# Patient Record
Sex: Female | Born: 2000 | Race: Black or African American | Hispanic: No | Marital: Single | Smoking: Never smoker
Health system: Southern US, Community
[De-identification: ages and names within clinical notes are randomized; demographics above are authoritative.]

## PROBLEM LIST (undated history)

## (undated) ENCOUNTER — Ambulatory Visit (HOSPITAL_COMMUNITY): Admission: EM | Payer: No Typology Code available for payment source

## (undated) DIAGNOSIS — J45909 Unspecified asthma, uncomplicated: Secondary | ICD-10-CM

## (undated) HISTORY — PX: NO PAST SURGERIES: SHX2092

---
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
## 2019-09-22 ENCOUNTER — Encounter (HOSPITAL_COMMUNITY): Payer: Self-pay

## 2019-09-22 ENCOUNTER — Ambulatory Visit (HOSPITAL_COMMUNITY)
Admission: EM | Admit: 2019-09-22 | Discharge: 2019-09-22 | Disposition: A | Discharge status: Home / Self Care | Payer: BLUE CROSS/BLUE SHIELD | Attending: Family Medicine | Admitting: Family Medicine

## 2019-09-22 ENCOUNTER — Other Ambulatory Visit: Payer: Self-pay

## 2019-09-22 DIAGNOSIS — Z20828 Contact with and (suspected) exposure to other viral communicable diseases: Secondary | ICD-10-CM | POA: Insufficient documentation

## 2019-09-22 DIAGNOSIS — R112 Nausea with vomiting, unspecified: Secondary | ICD-10-CM | POA: Insufficient documentation

## 2019-09-22 MED ORDER — ONDANSETRON 4 MG PO TBDP: 4.0000 mg | ORAL_TABLET | Freq: Three times a day (TID) | ORAL | 0 refills | Status: AC | PRN

## 2019-09-22 NOTE — ED Provider Notes (Signed)
[facility]    CSN: [id number] Arrival date & time: 09/22/19  1338      History   Chief Complaint Chief Complaint  Patient presents with  . food poison    HPI Cathy Nguyen is a 18 y.o. female history of asthma presenting today for Covid testing and evaluation of vomiting.  Patient states that she was at work earlier today and had an episode of vomiting.  She relates this to eating bad sushi.  Since the episode of vomiting she has not continued to feel nauseous and denies any associate abdominal pain.  Denies fevers chills or body aches.  Denies any URI symptoms of cough congestion or sore throat.  Denies any known Covid exposure.  Patient would like to be Covid tested in order to return back to work.  Denies diarrhea or change in bowel movements.  Has tolerated fluids since initial vomiting episode.  HPI  Past Medical History:  Diagnosis Date  . Asthma     There are no active problems to display for this patient.   History reviewed. No pertinent surgical history.  OB History   No obstetric history on file.      Home Medications    Prior to Admission medications   Medication Sig Start Date End Date Taking? Authorizing Provider  HYDROcodone-acetaminophen (NORCO/VICODIN) 5-325 MG per tablet Take 1-2 tablets by mouth every 6 (six) hours as needed for severe pain. 12/28/14   Piepenbrink, Victorino Dike, PA-C  ondansetron (ZOFRAN ODT) 4 MG disintegrating tablet Take 1 tablet (4 mg total) by mouth every 8 (eight) hours as needed for nausea or vomiting. 09/22/19   Jenni Thew, Junius Creamer, PA-C    Family History History reviewed. No pertinent family history.  Social History Social History   Tobacco Use  . Smoking status: Never Smoker  . Smokeless tobacco: Never Used  Substance Use Topics  . Alcohol use: No  . Drug use: Not on file     Allergies   Patient has no known allergies.   Review of Systems Review of Systems  Constitutional: Negative for activity  change, appetite change, chills, fatigue and fever.  HENT: Negative for congestion, ear pain, rhinorrhea, sinus pressure, sore throat and trouble swallowing.   Eyes: Negative for discharge and redness.  Respiratory: Negative for cough, chest tightness and shortness of breath.   Cardiovascular: Negative for chest pain.  Gastrointestinal: Positive for vomiting. Negative for abdominal pain, diarrhea and nausea.  Musculoskeletal: Negative for myalgias.  Skin: Negative for rash.  Neurological: Negative for dizziness, light-headedness and headaches.     Physical Exam Triage Vital Signs ED Triage Vitals  Enc Vitals Group     BP 09/22/19 1411 126/73     Pulse Rate 09/22/19 1411 82     Resp 09/22/19 1411 17     Temp 09/22/19 1411 98 F (36.7 C)     Temp Source 09/22/19 1411 Tympanic     SpO2 09/22/19 1411 99 %     Weight 09/22/19 1440 210 lb (95.3 kg)     Height --      Head Circumference --      Peak Flow --      Pain Score 09/22/19 1440 3     Pain Loc --      Pain Edu? --      Excl. in GC? --    No data found.  Updated Vital Signs BP 126/73 (BP Location: Left Arm)   Pulse 82   Temp 98 F (  36.7 C) (Tympanic)   Resp 17   Wt 210 lb (95.3 kg)   LMP 09/08/2019 (LMP Unknown)   SpO2 99%   Visual Acuity Right Eye Distance:   Left Eye Distance:   Bilateral Distance:    Right Eye Near:   Left Eye Near:    Bilateral Near:     Physical Exam Vitals signs and nursing note reviewed.  Constitutional:      General: She is not in acute distress.    Appearance: She is well-developed.  HENT:     Head: Normocephalic and atraumatic.     Mouth/Throat:     Comments: Oral mucosa pink and moist, no tonsillar enlargement or exudate. Posterior pharynx patent and nonerythematous, no uvula deviation or swelling. Normal phonation.  Eyes:     Conjunctiva/sclera: Conjunctivae normal.  Neck:     Musculoskeletal: Neck supple.  Cardiovascular:     Rate and Rhythm: Normal rate and regular  rhythm.     Heart sounds: No murmur.  Pulmonary:     Effort: Pulmonary effort is normal. No respiratory distress.     Breath sounds: Normal breath sounds.     Comments: Breathing comfortably at rest, CTABL, no wheezing, rales or other adventitious sounds auscultated Abdominal:     Palpations: Abdomen is soft.     Tenderness: There is no abdominal tenderness.     Comments: Soft, nondistended, nontender to light and deep palpation throughout entire abdomen, patient is ticklish with abdominal exam  Skin:    General: Skin is warm and dry.  Neurological:     Mental Status: She is alert.      UC Treatments / Results  Labs (all labs ordered are listed, but only abnormal results are displayed) Labs Reviewed  NOVEL CORONAVIRUS, NAA (HOSP ORDER, SEND-OUT TO REF LAB; TAT 18-24 HRS)    EKG   Radiology No results found.  Procedures Procedures (including critical care time)  Medications Ordered in UC Medications - No data to display  Initial Impression / Assessment and Plan / UC Course  I have reviewed the triage vital signs and the nursing notes.  Pertinent labs & imaging results that were available during my care of the patient were reviewed by me and considered in my medical decision making (see chart for details).    Covid swab pending.  Isolated episode of vomiting earlier today, no persistent symptoms.  Tolerating oral intake.  Will provide Zofran to use as needed if nausea and vomiting were to return.  No abdominal pain, do not suspect abdominal emergency.  Otherwise push fluids and remain home until Covid returns.Discussed strict return precautions. Patient verbalized understanding and is agreeable with plan.  Final Clinical Impressions(s) / UC Diagnoses   Final diagnoses:  Non-intractable vomiting with nausea, unspecified vomiting type     Discharge Instructions     COVID pending Zofran as needed for nausea Follow up if developing worsening symptoms, abodminal pain     ED Prescriptions    Medication Sig Dispense Auth. Provider   ondansetron (ZOFRAN ODT) 4 MG disintegrating tablet Take 1 tablet (4 mg total) by mouth every 8 (eight) hours as needed for nausea or vomiting. 20 tablet Brette Cast, [place] C, PA-C     PDMP not reviewed this encounter.   Janith Lima, PA-C 09/22/19 1524

## 2019-09-22 NOTE — Discharge Instructions (Signed)
COVID pending Zofran as needed for nausea Follow up if developing worsening symptoms, abodminal pain

## 2019-09-22 NOTE — ED Triage Notes (Signed)
Pt states that she had some bad sushi and needs a work note to return back to work. Pt states she would like a covid test today.

## 2019-09-24 LAB — NOVEL CORONAVIRUS, NAA (HOSP ORDER, SEND-OUT TO REF LAB; TAT 18-24 HRS): SARS-CoV-2, NAA: NOT DETECTED

## 2019-12-02 NOTE — L&D Delivery Note (Signed)
Delivery Note [facility]    Patient Name: Cathy Nguyen DOB: 09/14/2001 MRN: [id number]  Date of admission: 07/29/2020 Delivering MD: Dale San [place]  Date of delivery: 07/29/20 Type of delivery: SVD  Newborn Data: Live born female  Birth Weight:   APGAR: 8, 9   Newborn Delivery   Birth date/time: 07/29/2020 19:18:00 Delivery type:      [name], 19 y.o., @ [redacted]w[redacted]d,  G1P0, who was admitted for spontaneous latent labor and progressed after prodromal labor with AROM and pitcoin with moderate meconium. I was called to the room when she progressed 2+ station in the second stage of labor.  She pushed for 45/min.  She delivered a viable infant, cephalic and restituted to the LOA position over an intact perineum.  A nuchal cord   was not identified. The baby was placed on maternal abdomen while initial step of NRP were perfmored (Dry, Stimulated, and warmed). Hat placed on baby for thermoregulation. Delayed cord clamping was performed for 2 minutes.  Cord double clamped and cut.  Cord cut by father. Apgar scores were 8 and 9. Prophylactic Pitocin was started in the third stage of labor for active management. The placenta delivered spontaneously, shultz, with a 3 vessel cord and was sent to LD.  Inspection revealed 2nd degree. An examination of the vaginal vault and cervix was free from lacerations. The uterus was firm, bleeding stable.  The repair was done under epidural.   Placenta and umbilical artery blood gas were not sent.  There were no complications during the procedure.  Mom and baby skin to skin following delivery. Left in stable condition.  Maternal Info: Anesthesia:Epidural Episiotomy: NO Lacerations:  2.0 degree Suture Repair: 3.0 vicryl CT and 4.0 vicryl SH Est. Blood Loss (mL):   Newborn Info:  Baby Sex: female Circumcision: Desires in pt APGAR (1 MIN):  8 APGAR (5 MINS): 9  APGAR (10 MINS):     Mom to postpartum.  Baby to Couplet care / Skin to Skin.  DR Pin  aware   [place], CNM, NP-C 07/29/20 8:34 PM

## 2019-12-29 LAB — OB RESULTS CONSOLE ABO/RH: RH Type: POSITIVE

## 2019-12-29 LAB — OB RESULTS CONSOLE HEPATITIS B SURFACE ANTIGEN: Hepatitis B Surface Ag: NEGATIVE

## 2019-12-29 LAB — OB RESULTS CONSOLE ANTIBODY SCREEN: Antibody Screen: NEGATIVE

## 2019-12-29 LAB — OB RESULTS CONSOLE HIV ANTIBODY (ROUTINE TESTING): HIV: NONREACTIVE

## 2019-12-29 LAB — OB RESULTS CONSOLE RPR: RPR: NONREACTIVE

## 2019-12-29 LAB — OB RESULTS CONSOLE RUBELLA ANTIBODY, IGM: Rubella: IMMUNE

## 2020-01-09 LAB — OB RESULTS CONSOLE GC/CHLAMYDIA
Chlamydia: NEGATIVE
Gonorrhea: NEGATIVE

## 2020-07-26 ENCOUNTER — Telehealth (HOSPITAL_COMMUNITY): Payer: Self-pay | Admitting: *Deleted

## 2020-07-26 ENCOUNTER — Encounter (HOSPITAL_COMMUNITY): Payer: Self-pay | Admitting: *Deleted

## 2020-07-26 NOTE — Telephone Encounter (Signed)
Preadmission screen  

## 2020-07-27 ENCOUNTER — Telehealth (HOSPITAL_COMMUNITY): Payer: Self-pay | Admitting: *Deleted

## 2020-07-27 ENCOUNTER — Encounter (HOSPITAL_COMMUNITY): Payer: Self-pay | Admitting: *Deleted

## 2020-07-27 NOTE — Telephone Encounter (Signed)
Preadmission screen  

## 2020-07-28 ENCOUNTER — Other Ambulatory Visit (HOSPITAL_COMMUNITY)
Admission: RE | Admit: 2020-07-28 | Discharge: 2020-07-28 | Disposition: A | Discharge status: Home / Self Care | Payer: BC Managed Care – PPO | Source: Ambulatory Visit | Attending: Obstetrics and Gynecology | Admitting: Obstetrics and Gynecology

## 2020-07-28 DIAGNOSIS — Z01812 Encounter for preprocedural laboratory examination: Secondary | ICD-10-CM | POA: Insufficient documentation

## 2020-07-28 DIAGNOSIS — O26893 Other specified pregnancy related conditions, third trimester: Secondary | ICD-10-CM | POA: Diagnosis not present

## 2020-07-28 DIAGNOSIS — Z20822 Contact with and (suspected) exposure to covid-19: Secondary | ICD-10-CM | POA: Insufficient documentation

## 2020-07-28 LAB — SARS CORONAVIRUS 2 (TAT 6-24 HRS): SARS Coronavirus 2: NEGATIVE

## 2020-07-29 ENCOUNTER — Other Ambulatory Visit: Payer: Self-pay

## 2020-07-29 ENCOUNTER — Inpatient Hospital Stay (HOSPITAL_COMMUNITY): Payer: BC Managed Care – PPO | Admitting: Anesthesiology

## 2020-07-29 ENCOUNTER — Encounter (HOSPITAL_COMMUNITY): Payer: Self-pay | Admitting: Obstetrics and Gynecology

## 2020-07-29 ENCOUNTER — Inpatient Hospital Stay (HOSPITAL_COMMUNITY)
Admission: EM | Admit: 2020-07-29 | Discharge: 2020-07-31 | DRG: 807 | Disposition: A | Discharge status: Home / Self Care | Payer: BC Managed Care – PPO | Attending: Obstetrics and Gynecology | Admitting: Obstetrics and Gynecology

## 2020-07-29 DIAGNOSIS — O99214 Obesity complicating childbirth: Secondary | ICD-10-CM | POA: Diagnosis present

## 2020-07-29 DIAGNOSIS — Z01812 Encounter for preprocedural laboratory examination: Secondary | ICD-10-CM | POA: Diagnosis not present

## 2020-07-29 DIAGNOSIS — O26893 Other specified pregnancy related conditions, third trimester: Secondary | ICD-10-CM | POA: Diagnosis present

## 2020-07-29 DIAGNOSIS — Z20822 Contact with and (suspected) exposure to covid-19: Secondary | ICD-10-CM | POA: Diagnosis present

## 2020-07-29 DIAGNOSIS — Z3A40 40 weeks gestation of pregnancy: Secondary | ICD-10-CM

## 2020-07-29 LAB — CBC
HCT: 36.6 % (ref 36.0–46.0)
Hemoglobin: 11.7 g/dL — ABNORMAL LOW (ref 12.0–15.0)
MCH: 29.3 pg (ref 26.0–34.0)
MCHC: 32 g/dL (ref 30.0–36.0)
MCV: 91.5 fL (ref 80.0–100.0)
Platelets: 170 10*3/uL (ref 150–400)
RBC: 4 MIL/uL (ref 3.87–5.11)
RDW: 13.7 % (ref 11.5–15.5)
WBC: 10.6 10*3/uL — ABNORMAL HIGH (ref 4.0–10.5)
nRBC: 0 % (ref 0.0–0.2)

## 2020-07-29 LAB — TYPE AND SCREEN
ABO/RH(D): A POS
Antibody Screen: NEGATIVE

## 2020-07-29 LAB — ABO/RH: ABO/RH(D): A POS

## 2020-07-29 LAB — RPR: RPR Ser Ql: NONREACTIVE

## 2020-07-29 LAB — OB RESULTS CONSOLE ABO/RH

## 2020-07-29 MED ORDER — FENTANYL-BUPIVACAINE-NACL 0.5-0.125-0.9 MG/250ML-% EP SOLN: 12.0000 mL/h | EPIDURAL | Status: DC | PRN

## 2020-07-29 MED ORDER — TETANUS-DIPHTH-ACELL PERTUSSIS 5-2.5-18.5 LF-MCG/0.5 IM SUSP: 0.5000 mL | Freq: Once | INTRAMUSCULAR | Status: DC

## 2020-07-29 MED ORDER — OXYTOCIN-SODIUM CHLORIDE 30-0.9 UT/500ML-% IV SOLN
2.5000 [IU]/h | INTRAVENOUS | Status: DC
  Administered 2020-07-29: 2.5 [IU]/h via INTRAVENOUS
  Filled 2020-07-29: qty 500

## 2020-07-29 MED ORDER — PHENYLEPHRINE 40 MCG/ML (10ML) SYRINGE FOR IV PUSH (FOR BLOOD PRESSURE SUPPORT): 80.0000 ug | PREFILLED_SYRINGE | INTRAVENOUS | Status: DC | PRN

## 2020-07-29 MED ORDER — SODIUM CHLORIDE 0.9% FLUSH: 3.0000 mL | Freq: Two times a day (BID) | INTRAVENOUS | Status: DC

## 2020-07-29 MED ORDER — SOD CITRATE-CITRIC ACID 500-334 MG/5ML PO SOLN: 30.0000 mL | ORAL | Status: DC | PRN

## 2020-07-29 MED ORDER — DIBUCAINE (PERIANAL) 1 % EX OINT
1.0000 "application " | TOPICAL_OINTMENT | CUTANEOUS | Status: DC | PRN
  Filled 2020-07-29: qty 28

## 2020-07-29 MED ORDER — ACETAMINOPHEN 325 MG PO TABS: 650.0000 mg | ORAL_TABLET | ORAL | Status: DC | PRN

## 2020-07-29 MED ORDER — WITCH HAZEL-GLYCERIN EX PADS: 1.0000 "application " | MEDICATED_PAD | CUTANEOUS | Status: DC | PRN

## 2020-07-29 MED ORDER — COCONUT OIL OIL: 1.0000 "application " | TOPICAL_OIL | Status: DC | PRN

## 2020-07-29 MED ORDER — ZOLPIDEM TARTRATE 5 MG PO TABS: 5.0000 mg | ORAL_TABLET | Freq: Every evening | ORAL | Status: DC | PRN

## 2020-07-29 MED ORDER — LIDOCAINE HCL (PF) 1 % IJ SOLN: 30.0000 mL | INTRAMUSCULAR | Status: DC | PRN

## 2020-07-29 MED ORDER — FENTANYL CITRATE (PF) 2500 MCG/50ML IJ SOLN
INTRAMUSCULAR | Status: DC | PRN
Start: 2020-07-29 — End: 2020-07-29
  Administered 2020-07-29: 12 mL/h via EPIDURAL

## 2020-07-29 MED ORDER — OXYTOCIN 10 UNIT/ML IJ SOLN: 10.0000 [IU] | Freq: Once | INTRAMUSCULAR | Status: DC

## 2020-07-29 MED ORDER — IBUPROFEN 600 MG PO TABS
600.0000 mg | ORAL_TABLET | Freq: Four times a day (QID) | ORAL | Status: DC
  Administered 2020-07-29 – 2020-07-31 (×7): 600 mg via ORAL
  Filled 2020-07-29 (×7): qty 1

## 2020-07-29 MED ORDER — SODIUM CHLORIDE 0.9% FLUSH: 3.0000 mL | INTRAVENOUS | Status: DC | PRN

## 2020-07-29 MED ORDER — DIPHENHYDRAMINE HCL 50 MG/ML IJ SOLN: 12.5000 mg | INTRAMUSCULAR | Status: DC | PRN

## 2020-07-29 MED ORDER — DIPHENHYDRAMINE HCL 25 MG PO CAPS: 25.0000 mg | ORAL_CAPSULE | Freq: Four times a day (QID) | ORAL | Status: DC | PRN

## 2020-07-29 MED ORDER — LACTATED RINGERS IV SOLN: 500.0000 mL | Freq: Once | INTRAVENOUS | Status: DC

## 2020-07-29 MED ORDER — OXYTOCIN-SODIUM CHLORIDE 30-0.9 UT/500ML-% IV SOLN
1.0000 m[IU]/min | INTRAVENOUS | Status: DC
  Administered 2020-07-29: 2 m[IU]/min via INTRAVENOUS

## 2020-07-29 MED ORDER — BENZOCAINE-MENTHOL 20-0.5 % EX AERO
1.0000 "application " | INHALATION_SPRAY | CUTANEOUS | Status: DC | PRN
  Filled 2020-07-29 (×2): qty 56

## 2020-07-29 MED ORDER — FENTANYL CITRATE (PF) 100 MCG/2ML IJ SOLN: 50.0000 ug | INTRAMUSCULAR | Status: DC | PRN

## 2020-07-29 MED ORDER — LACTATED RINGERS IV SOLN: INTRAVENOUS | Status: DC

## 2020-07-29 MED ORDER — OXYTOCIN BOLUS FROM INFUSION
333.0000 mL | Freq: Once | INTRAVENOUS | Status: AC
  Administered 2020-07-29: 333 mL via INTRAVENOUS

## 2020-07-29 MED ORDER — FENTANYL-BUPIVACAINE-NACL 0.5-0.125-0.9 MG/250ML-% EP SOLN
EPIDURAL | Status: AC
  Filled 2020-07-29: qty 250

## 2020-07-29 MED ORDER — ONDANSETRON HCL 4 MG PO TABS: 4.0000 mg | ORAL_TABLET | ORAL | Status: DC | PRN

## 2020-07-29 MED ORDER — SODIUM CHLORIDE 0.9 % IV SOLN: 250.0000 mL | INTRAVENOUS | Status: DC | PRN

## 2020-07-29 MED ORDER — SENNOSIDES-DOCUSATE SODIUM 8.6-50 MG PO TABS
2.0000 | ORAL_TABLET | ORAL | Status: DC
  Administered 2020-07-29 – 2020-07-30 (×2): 2 via ORAL
  Filled 2020-07-29 (×2): qty 2

## 2020-07-29 MED ORDER — EPHEDRINE 5 MG/ML INJ: 10.0000 mg | INTRAVENOUS | Status: DC | PRN

## 2020-07-29 MED ORDER — PRENATAL MULTIVITAMIN CH
1.0000 | ORAL_TABLET | Freq: Every day | ORAL | Status: DC
  Administered 2020-07-30 – 2020-07-31 (×2): 1 via ORAL
  Filled 2020-07-29 (×2): qty 1

## 2020-07-29 MED ORDER — TERBUTALINE SULFATE 1 MG/ML IJ SOLN: 0.2500 mg | Freq: Once | INTRAMUSCULAR | Status: DC | PRN

## 2020-07-29 MED ORDER — LACTATED RINGERS IV SOLN: 500.0000 mL | INTRAVENOUS | Status: DC | PRN

## 2020-07-29 MED ORDER — SIMETHICONE 80 MG PO CHEW: 80.0000 mg | CHEWABLE_TABLET | ORAL | Status: DC | PRN

## 2020-07-29 MED ORDER — LIDOCAINE HCL (PF) 1 % IJ SOLN
INTRAMUSCULAR | Status: DC | PRN
  Administered 2020-07-29: 6 mL via EPIDURAL

## 2020-07-29 MED ORDER — ACETAMINOPHEN 500 MG PO TABS: 1000.0000 mg | ORAL_TABLET | Freq: Four times a day (QID) | ORAL | Status: DC | PRN

## 2020-07-29 MED ORDER — ONDANSETRON HCL 4 MG/2ML IJ SOLN: 4.0000 mg | Freq: Four times a day (QID) | INTRAMUSCULAR | Status: DC | PRN

## 2020-07-29 MED ORDER — ONDANSETRON HCL 4 MG/2ML IJ SOLN: 4.0000 mg | INTRAMUSCULAR | Status: DC | PRN

## 2020-07-29 NOTE — Progress Notes (Signed)
Labor Progress Note  Cathy Nguyen is a 19 y.o. female, G1P0000, Pt of Dr Richardson Dopp with parental care at [facility] @ 10.5 weeks with an IUP at 40 weeks, presenting for Latent labor being admitted at 5cm. Pt endorse + Fm. Denies vaginal leakage. Denies vaginal bleeding. PN labs, A+, rubella immune, gbs-, HIV neg, RPR NR, G/C neg, hgb at NOB was 13- @ 28 weeks was 11.6. Cathy Female desires in pt circ.   Subjective: Pt stable and resting well with family at bedside , supportive. Pt comfortable with epidural, and continues to rotation position for fetal rotation.  Patient Active Problem List   Diagnosis Date Noted  . Normal labor 07/29/2020   Objective: BP 131/64   Pulse 84   Temp (!) 96.9 F (36.1 C) (Axillary)   Resp 18   Ht 5\' 10"  (1.778 m)   Wt 108.6 kg   LMP 09/08/2019 (LMP Unknown)   BMI 34.35 kg/m  No intake/output data recorded. No intake/output data recorded. NST: FHR baseline 130 bpm, Variability: moderate, Accelerations:present, Decelerations:  Absent= Cat 1/Reactive CTX:  irregular, every 4-5 minutes, lasting 50-60 seconds Uterus gravid, soft non tender, moderate to palpate with contractions.  SVE:  Dilation: 6 Effacement (%): 80 Station: -1, 0 Exam by:: [facility] CNM Pitocin at (8) mUn/min  IUPC placed with ease, tolerated well.   Assessment:  Cathy Nguyen is a 19 y.o. female, G1P0000, Pt of Dr 26 with parental care at [facility] Physician @ 10.5 weeks with an IUP at 40 weeks, presenting for Latent labor being admitted at 5cm. Pt endorse + Fm. Denies vaginal leakage. Denies vaginal bleeding. PN labs, A+, rubella immune, gbs-, HIV neg, RPR NR, G/C neg, hgb at NOB was 13- @ 28 weeks was 11.6. Cathy Female desires in pt circ. Prodromal with AROM and pitocin in active labor, but cxt stalled and became irregular post epidural. Occ prolonged variable noted during vaginal checks. Pitocin infusing, IUPC placed.  Patient Active Problem List   Diagnosis Date Noted  . Normal labor  07/29/2020   NICHD: Category 1  Membranes:  AROM, @ 0830 on 8/29, moderate meconium x 7hrs, no s/s of infection  Induction:    Pitocin - 8  IUPC Placed: MVU 200, labor now adequate @ 1530 on 8/29.   Pain management:               Epidural placement:  at 8/29 on 0900  GBS Negative   Plan: Continue labor plan Continuous monitoring Rest Frequent position changes to facilitate fetal rotation and descent. Will reassess with cervical exam at 1900 or earlier if necessary Continue pitocin per protocol 2x2 Cathy Female: In pt circ desired Anticipate labor progression and vaginal delivery.   9/29, NP-C, CNM, MSN 07/29/2020. 4:11 PM

## 2020-07-29 NOTE — ED Provider Notes (Signed)
MSE was initiated and I personally evaluated the patient and placed orders (if any) at  4:13 AM on July 29, 2020.  The patient appears stable so that the remainder of the MSE may be completed by another provider.  Patient presents with contractions.  She is [redacted] weeks pregnant today.  First pregnancy.  Contractions every 4 minutes.  Reports good fetal movement.  Has noted some light pink spotting.  Unclear whether water broke.  On exam she is comfortable appearing and nontoxic.  Patient cleared for transfer to MAU.    Shon Baton, MD 07/29/20 [phone]

## 2020-07-29 NOTE — H&P (Signed)
Cathy Nguyen is a 19 y.o. female, G1P0000, Pt of Dr Richardson Dopp with parental care at [facility] Physician @ 10.5 weeks with an IUP at 40 weeks, presenting for Latent labor being admitted at 5cm. Pt endorse + Fm. Denies vaginal leakage. Denies vaginal bleeding. PN labs, A+, rubella immune, gbs-, HIV neg, RPR NR, G/C neg, hgb at NOB was 13- @ 28 weeks was 11.6. Baby Female desires in pt circ.   Patient Active Problem List   Diagnosis Date Noted  . Normal labor 07/29/2020    Medications Prior to Admission  Medication Sig Dispense Refill Last Dose  . HYDROcodone-acetaminophen (NORCO/VICODIN) 5-325 MG per tablet Take 1-2 tablets by mouth every 6 (six) hours as needed for severe pain. 15 tablet 0   . ondansetron (ZOFRAN ODT) 4 MG disintegrating tablet Take 1 tablet (4 mg total) by mouth every 8 (eight) hours as needed for nausea or vomiting. (Patient not taking: Reported on 07/29/2020) 20 tablet 0 Not Taking at not taking    Past Medical History:  Diagnosis Date  . Asthma      No current facility-administered medications on file prior to encounter.   Current Outpatient Medications on File Prior to Encounter  Medication Sig Dispense Refill  . HYDROcodone-acetaminophen (NORCO/VICODIN) 5-325 MG per tablet Take 1-2 tablets by mouth every 6 (six) hours as needed for severe pain. 15 tablet 0  . ondansetron (ZOFRAN ODT) 4 MG disintegrating tablet Take 1 tablet (4 mg total) by mouth every 8 (eight) hours as needed for nausea or vomiting. (Patient not taking: Reported on 07/29/2020) 20 tablet 0     No Known Allergies  OB History    Gravida  1   Para      Term      Preterm      AB      Living        SAB      TAB      Ectopic      Multiple      Live Births             Past Medical History:  Diagnosis Date  . Asthma    Past Surgical History:  Procedure Laterality Date  . NO PAST SURGERIES     Family History: family history is not on file. Social History:  reports that she has never  smoked. She has never used smokeless tobacco. She reports that she does not drink alcohol and does not use drugs.   Prenatal Transfer Tool  Maternal Diabetes: No Genetic Screening: Normal Maternal Ultrasounds/Referrals: Normal Fetal Ultrasounds or other Referrals:  None Maternal Substance Abuse:  No Significant Maternal Medications:  None Significant Maternal Lab Results: Group B Strep negative  ROS:  Review of Systems  Constitutional: Negative.   HENT: Negative.   Eyes: Negative.   Respiratory: Negative.   Cardiovascular: Negative.   Gastrointestinal: Positive for abdominal pain.  Genitourinary: Negative.   Musculoskeletal: Negative.   Skin: Negative.   Neurological: Negative.   Endo/Heme/Allergies: Negative.   Psychiatric/Behavioral: Negative.      Physical Exam: BP 125/69   Pulse 91   Temp 98.5 F (36.9 C) (Axillary)   Resp 18   Ht 5\' 10"  (1.778 m)   Wt 108.6 kg   LMP 09/08/2019 (LMP Unknown)   BMI 34.35 kg/m   Physical Exam Vitals and nursing note reviewed. Exam conducted with a chaperone present.  Constitutional:      Appearance: Normal appearance.  HENT:  Head: Normocephalic and atraumatic.     Nose: Nose normal.     Mouth/Throat:     Mouth: Mucous membranes are moist.  Eyes:     Extraocular Movements: Extraocular movements intact.     Pupils: Pupils are equal, round, and reactive to light.  Cardiovascular:     Rate and Rhythm: Normal rate and regular rhythm.     Pulses: Normal pulses.     Heart sounds: Normal heart sounds.  Pulmonary:     Effort: Pulmonary effort is normal.     Breath sounds: Normal breath sounds.  Abdominal:     General: Bowel sounds are normal.     Palpations: Abdomen is soft.  Genitourinary:    Comments: Pelvis adequate, uterus gravida equal to dates.  Musculoskeletal:        General: Normal range of motion.     Cervical back: Normal range of motion and neck supple.  Skin:    General: Skin is warm and dry.     Capillary  Refill: Capillary refill takes less than 2 seconds.  Neurological:     General: No focal deficit present.     Mental Status: She is alert and oriented to person, place, and time.  Psychiatric:        Mood and Affect: Mood normal.      NST: FHR baseline 125 bpm, Variability: moderate, Accelerations:present, Decelerations:  Absent= Cat 1/Reactive UC:   irregular, every 2-6 minutes, lasting 60-80 seconds. Pt had x1 prolonged decel to nadir 80s after epidural placement and AROM with moderate meconium. With resolution with position change and fluid bolus.  SVE:   Dilation: 6 Effacement (%): 80 Station: -1, 0 Exam by:: [facility] CNM, vertex verified by fetal sutures.  Leopold's: Position vertex, EFW 8.5-9lbs via leopold's.   AROM @ 0830 on 8/29, x1 decel noted after moderate meconium noted.   Labs: Results for orders placed or performed during the hospital encounter of 07/29/20 (from the past 24 hour(s))  CBC     Status: Abnormal   Collection Time: 07/29/20  6:50 AM  Result Value Ref Range   WBC 10.6 (H) 4.0 - 10.5 K/uL   RBC 4.00 3.87 - 5.11 MIL/uL   Hemoglobin 11.7 (L) 12.0 - 15.0 g/dL   HCT 02.5 36 - 46 %   MCV 91.5 80.0 - 100.0 fL   MCH 29.3 26.0 - 34.0 pg   MCHC 32.0 30.0 - 36.0 g/dL   RDW 42.7 06.2 - 37.6 %   Platelets 170 150 - 400 K/uL   nRBC 0.0 0.0 - 0.2 %  Type and screen [facility]     Status: None   Collection Time: 07/29/20  6:50 AM  Result Value Ref Range   ABO/RH(D) A POS    Antibody Screen NEG    Sample Expiration      08/01/2020,2359 Performed at [facility], [address] [place], [place] [address]   ABO/Rh     Status: None   Collection Time: 07/29/20  8:30 AM  Result Value Ref Range   ABO/RH(D)      A POS Performed at [facility], [address] [place], [place] [address]     Imaging:  No results found.  MAU Course: Orders Placed This Encounter  Procedures  . CBC  . RPR  . Diet clear liquid Room service  appropriate? Yes; Fluid consistency: Thin  . Informed Consent Details: Physician/Practitioner Attestation; Transcribe to consent form and obtain  patient signature  . Vitals signs per unit policy  . Notify Physician  . Fetal monitoring per unit policy  . Activity as tolerated  . Cervical Exam  . Measure blood pressure post delivery every 15 min x 1 hour then every 30 min x 1 hour  . Fundal check post delivery every 15 min x 1 hour then every 30 min x 1 hour  . If Rapid HIV test positive or known HIV positive: initiate AZT orders  . May in and out cath x 2 for inability to void  . Insert foley catheter  . Discontinue foley prior to vaginal delivery  . Initiate Carrier Fluid Protocol  . Initiate Oral Care Protocol  . Order Rapid HIV per protocol if no results on chart  . Patient may have epidural placement upon request  . May use local infiltration of 1% lidocaine plain to produce a skin wheal prior to IV insertion  . Notify in-house Anesthesia team of nausea and vomiting greater than 5 hours  . Assess for signs/symptoms of PIH/preeclampsia  . RN to place order for: CBC if one has not been drawn in the past 6 hours for all patients with hypertensive disease, pre-eclampsia, eclampsia, thrombocytopenia or previous PLTC<150,000.  . Identify to Anesthesia if patient plans to have postpartum tubal ligation; do not remove epidural without discussion with Anesthesiologist  . Vital signs following Epidural Placement, re-bolus or re-dose monitor patient's BP and oxygen saturation every 5 minutes for 30 minutes  . RN to remain at bedside continuously for 30 minutes post epidural placement, post re-bolus / re-dose  . Notify Anesthesia if the patient becomes short of breath or complains of heaviness in chest, chest pain, and/or unrelieved pain  . Notify Anesthesia prior to discontinuing epidural infusion  . May use local infiltration of 1% lidocaine plain to produce a skin wheal prior to IV insertion  .  Notify in-house Anesthesia team of nausea and vomiting greater than 5 hours  . Assess for signs/symptoms of PIH/preeclampsia  . RN to place order for: CBC if one has not been drawn in the past 6 hours for all patients with hypertensive disease, pre-eclampsia, eclampsia, thrombocytopenia or previous PLTC<150,000.  . Identify to Anesthesia if patient plans to have postpartum tubal ligation; do not remove epidural without discussion with Anesthesiologist  . Vital signs following Epidural Placement, re-bolus or re-dose monitor patient's BP and oxygen saturation every 5 minutes for 30 minutes  . RN to remain at bedside continuously for 30 minutes post epidural placement, post re-bolus / re-dose  . Notify Anesthesia if the patient becomes short of breath or complains of heaviness in chest, chest pain, and/or unrelieved pain  . Notify Anesthesia prior to discontinuing epidural infusion  . Full code  . Type and screen [facility]  . ABO/Rh  . Insert and maintain IV Line  . Admit to Inpatient (patient's expected length of stay will be greater than 2 midnights or inpatient only procedure)   Meds ordered this encounter  Medications  . lactated ringers infusion  . oxytocin (PITOCIN) IV BOLUS FROM BAG  . oxytocin (PITOCIN) IV infusion 30 units in NS 500 mL - Premix  . sodium chloride flush (NS) 0.9 % injection 3 mL  . sodium chloride flush (NS) 0.9 % injection 3 mL  . 0.9 %  sodium chloride infusion  . lactated ringers infusion 500-1,000 mL  . acetaminophen (TYLENOL) tablet 1,000 mg  . fentaNYL (SUBLIMAZE) injection 50-100 mcg  . ondansetron (  ZOFRAN) injection 4 mg  . sodium citrate-citric acid (ORACIT) solution 30 mL  . oxytocin (PITOCIN) injection 10 Units  . lidocaine (PF) (XYLOCAINE) 1 % injection 30 mL  . ePHEDrine injection 10 mg  . PHENYLephrine 40 mcg/ml in normal saline Adult IV Push Syringe (For Blood Pressure Support)  . lactated ringers infusion 500 mL  . DISCONTD:  fentaNYL 2 mcg/mL w/ bupivacaine 0.125% in NS 250 mL epidural infusion ([facility]-ANES)  . DISCONTD: diphenhydrAMINE (BENADRYL) injection 12.5 mg  . ePHEDrine injection 10 mg  . PHENYLephrine 40 mcg/ml in normal saline Adult IV Push Syringe (For Blood Pressure Support)  . fentaNYL 2 mcg/mL w/bupivacaine 0.125% in NS 250 mL 0.5-0.125-0.9 MG/250ML-%    Hamilton, Jennifer  : cabinet override  . fentaNYL 2 mcg/mL w/ bupivacaine 0.125% in NS 250 mL epidural infusion ([facility]-ANES)  . diphenhydrAMINE (BENADRYL) injection 12.5 mg    Assessment/Plan: Cathy Nguyen is a 19 y.o. female, G1P0000, Pt of Dr Richardson Doppole with [facility] @ 10.5 weeks with an IUP at 40 weeks, presenting for Latent labor being admitted at 5cm. Pt endorse + Fm. Denies vaginal leakage. Denies vaginal bleeding. PN labs, A+, rubella immune, gbs-, HIV neg, RPR NR, G/C neg, hgb at NOB was 13- @ 28 weeks was 11.6. Baby Female desires in pt circ.   FWB: Cat 1 Fetal Tracing.   Discussed R/B/A of AROM, pt verbalized consent. Currently pt is progressing in active labor at 6cm with new AROM, moderate meconium.   Plan: Admit to Birthing Suite per consult with DR Mora ApplPinn Routine [facility] orders Pain med/epidural prn Anticipate labor progression   Dale DurhamJade Aarvi Stotts NP-C, CNM, MSN 07/29/2020, 10:18 AM

## 2020-07-29 NOTE — Anesthesia Preprocedure Evaluation (Signed)
Anesthesia Evaluation  Patient identified by MRN, date of birth, ID band Patient awake    Reviewed: Allergy & Precautions, H&P , NPO status , Patient's Chart, lab work & pertinent test results, reviewed documented beta blocker date and time   Airway Mallampati: I  TM Distance: >3 FB Neck ROM: full    Dental no notable dental hx. (+) Teeth Intact, Dental Advisory Given   Pulmonary asthma ,    Pulmonary exam normal breath sounds clear to auscultation       Cardiovascular negative cardio ROS Normal cardiovascular exam Rhythm:regular Rate:Normal     Neuro/Psych negative neurological ROS  negative psych ROS   GI/Hepatic negative GI ROS, Neg liver ROS,   Endo/Other  Morbid obesity  Renal/GU negative Renal ROS  negative genitourinary   Musculoskeletal   Abdominal   Peds  Hematology  (+) Blood dyscrasia, anemia ,   Anesthesia Other Findings   Reproductive/Obstetrics (+) Pregnancy                             Anesthesia Physical Anesthesia Plan  ASA: III  Anesthesia Plan: Epidural   Post-op Pain Management:    Induction:   PONV Risk Score and Plan: 2 and Scopolamine patch - Pre-op  Airway Management Planned:   Additional Equipment:   Intra-op Plan:   Post-operative Plan:   Informed Consent: I have reviewed the patients History and Physical, chart, labs and discussed the procedure including the risks, benefits and alternatives for the proposed anesthesia with the patient or authorized representative who has indicated his/her understanding and acceptance.     Dental Advisory Given  Plan Discussed with: Anesthesiologist  Anesthesia Plan Comments: (Labs checked- platelets confirmed with RN in room. Fetal heart tracing, per RN, reported to be stable enough for sitting procedure. Discussed epidural, and patient consents to the procedure:  included risk of possible headache,backache,  failed block, allergic reaction, and nerve injury. This patient was asked if she had any questions or concerns before the procedure started.)        Anesthesia Quick Evaluation

## 2020-07-29 NOTE — Anesthesia Procedure Notes (Signed)
Epidural Patient location during procedure: OB Start time: 07/29/2020 7:45 AM End time: 07/29/2020 7:50 AM  Staffing Anesthesiologist: Bethena Midget, MD  Preanesthetic Checklist Completed: patient identified, IV checked, site marked, risks and benefits discussed, surgical consent, monitors and equipment checked, pre-op evaluation and timeout performed  Epidural Patient position: sitting Prep: DuraPrep and site prepped and draped Patient monitoring: continuous pulse ox and blood pressure Approach: midline Location: L3-L4 Injection technique: LOR air  Needle:  Needle type: Tuohy  Needle gauge: 17 G Needle length: 9 cm and 9 Needle insertion depth: 7 cm Catheter type: closed end flexible Catheter size: 19 Gauge Catheter at skin depth: 12 cm Test dose: negative  Assessment Events: blood not aspirated, injection not painful, no injection resistance, no paresthesia and negative IV test

## 2020-07-29 NOTE — Progress Notes (Signed)
Labor Progress Note  Cathy Nguyen is a 19 y.o. female, G1P0000, Pt of Dr Richardson Dopp with parental care at [facility] Physician @ 10.5 weeks with an IUP at 40 weeks, presenting for Latent labor being admitted at 5cm. Pt endorse + Fm. Denies vaginal leakage. Denies vaginal bleeding. PN labs, A+, rubella immune, gbs-, HIV neg, RPR NR, G/C neg, hgb at NOB was 13- @ 28 weeks was 11.6. Baby Female desires in pt circ.   Subjective: Pt stable and resting well with family at bedside , supportive. Discussed no cervical change in last 4 hours and pitocin, discussed R/B/A of pitocin and IUPC, pt consented as indicated.  Patient Active Problem List   Diagnosis Date Noted  . Normal labor 07/29/2020   Objective: BP 113/86   Pulse 89   Temp (!) 96.9 F (36.1 C) (Axillary)   Resp 18   Ht 5\' 10"  (1.778 m)   Wt 108.6 kg   LMP 09/08/2019 (LMP Unknown)   BMI 34.35 kg/m  No intake/output data recorded. No intake/output data recorded. NST: FHR baseline 130 bpm, Variability: moderate, Accelerations:present, Decelerations:  Absent= Cat 1/Reactive CTX:  irregular, every 4-6 minutes Uterus gravid, soft non tender, moderate to palpate with contractions.  SVE:  Dilation: 6 Effacement (%): 80 Station: 0 Exam by:: [email or web address] CNM Pitocin at (start at 2 now) mUn/min  Assessment:  Cathy Nguyen is a 19 y.o. female, G1P0000, Pt of Dr 26 with parental care at [facility] Physician @ 10.5 weeks with an IUP at 40 weeks, presenting for Latent labor being admitted at 5cm. Pt endorse + Fm. Denies vaginal leakage. Denies vaginal bleeding. PN labs, A+, rubella immune, gbs-, HIV neg, RPR NR, G/C neg, hgb at NOB was 13- @ 28 weeks was 11.6. Baby Female desires in pt circ. Progressing with AROM and starting pitocin in active labor, but cxt stalled and became irregular post epidural. Occ prolonged variable noted during vaginal checks.  Patient Active Problem List   Diagnosis Date Noted  . Normal labor 07/29/2020   NICHD: Category  1  Membranes:  AROM, @ 0830 on 8/29, moderate meconium x 4hrs, no s/s of infection  Induction:    Pitocin - start at 2 now  Pain management:               Epidural placement:  at 8/29 on 0900  GBS Negative   Plan: Continue labor plan Continuous monitoring Rest Frequent position changes to facilitate fetal rotation and descent. Will reassess with cervical exam at 1600 or earlier if necessary Start pitocin per protocol 2x2 Baby Female: In pt circ desired Anticipate labor progression and vaginal delivery.   Md Pinn aware of plan and verbalized agreement.   9/29, NP-C, CNM, MSN 07/29/2020. 12:32 PM

## 2020-07-29 NOTE — MAU Note (Signed)
..  Cathy Nguyen is a 19 y.o. at [redacted]w[redacted]d here in MAU reporting: ctx every 4 minutes since last night. Pt reports some bloody show. Denies LOF. +FM  Pain score: 8/10 with CTX  Lab orders placed from triage: MAU standing labor eval.

## 2020-07-30 LAB — CBC
HCT: 30.8 % — ABNORMAL LOW (ref 36.0–46.0)
Hemoglobin: 10 g/dL — ABNORMAL LOW (ref 12.0–15.0)
MCH: 29.6 pg (ref 26.0–34.0)
MCHC: 32.5 g/dL (ref 30.0–36.0)
MCV: 91.1 fL (ref 80.0–100.0)
Platelets: 173 10*3/uL (ref 150–400)
RBC: 3.38 MIL/uL — ABNORMAL LOW (ref 3.87–5.11)
RDW: 13.6 % (ref 11.5–15.5)
WBC: 15.2 10*3/uL — ABNORMAL HIGH (ref 4.0–10.5)
nRBC: 0.1 % (ref 0.0–0.2)

## 2020-07-30 NOTE — Anesthesia Postprocedure Evaluation (Signed)
Anesthesia Post Note  Patient: Cathy Nguyen  Procedure(s) Performed: AN AD HOC LABOR EPIDURAL     Patient location during evaluation: Mother Baby Anesthesia Type: Epidural Level of consciousness: awake and alert Pain management: pain level controlled Vital Signs Assessment: post-procedure vital signs reviewed and stable Respiratory status: spontaneous breathing, nonlabored ventilation and respiratory function stable Cardiovascular status: stable Postop Assessment: no headache, no backache, epidural receding, no apparent nausea or vomiting, patient able to bend at knees, adequate PO intake and able to ambulate Anesthetic complications: no   No complications documented.  Last Vitals:  Vitals:   07/30/20 0230 07/30/20 0635  BP: 133/81 135/77  Pulse: 80 77  Resp: 18 18  Temp: (!) 36.4 C 36.8 C  SpO2: 100% 100%    Last Pain:  Vitals:   07/30/20 0635  TempSrc: Oral  PainSc: 5    Pain Goal: Patients Stated Pain Goal: 0 (07/29/20 0430)                 Laban Emperor

## 2020-07-30 NOTE — Lactation Note (Signed)
This note was copied from a baby's chart. Lactation Consultation Note  Patient Name: Cathy Nguyen [id number] Date: 07/30/2020 Reason for consult: Follow-up assessment 2nd LC visit today - Jolayne Haines RN mentioned the baby was latched.  LC revisited mom and baby latched on the left breast / football with depth and  Firm support with swallows. LC showed mom how to do breast compressions and increased swallows noted, along with breast compressions.  Per mom comfortable and baby still feeding.  LC instructed mom on the use of the hand pump since it was at the bedside.  As this point baby is latching so its not needed.  Mom and grandmother receptive to teaching.   Maternal Data Has patient been taught Hand Expression?: Yes  Feeding Feeding Type:  (RN mentioned baby was latched and LC assessed - good swallow)  LATCH Score Latch:  (latched with depth )  Audible Swallowing:  (increased  swallows with breast compressions )  Type of Nipple: Everted at rest and after stimulation  Comfort (Breast/Nipple):  (per mom comfortable )  Hold (Positioning):  (per mom the nurse helped )  [facility] Score: 8  Interventions Interventions: Breast feeding basics reviewed  Lactation Tools Discussed/Used Pump Review: Setup, frequency, and cleaning Initiated by:: MAI  Date initiated:: 07/30/20   Consult Status Consult Status: Follow-up Date: 07/31/20 Follow-up type: In-patient    Cathy Nguyen 07/30/2020, 11:09 AM

## 2020-07-30 NOTE — Progress Notes (Addendum)
Postpartum Note Day #1 s/p SVD complicated by second degree laceration.  S:  Patient resting comfortable in bed.  Pain controlled.  Tolerating regualr diet. No flatus, no BM.  Lochia minimal.  Ambulating without difficulty.  She denies n/v/f/c, SOB, or CP.  Pt plans on breastfeeding.  She is undecided on contraception.  O: Temp:  [96.8 F (36 C)-100 F (37.8 C)] 98.1 F (36.7 C) (08/30 0950) Pulse Rate:  [73-110] 87 (08/30 0950) Resp:  [16-18] 18 (08/30 0950) BP: (113-147)/(46-102) 123/69 (08/30 0950) SpO2:  [100 %] 100 % (08/30 0950) Gen: A&Ox3, NAD CV: RRR, no MRG Resp: CTAB Abdomen: soft, NT, ND  Uterus: firm, non-tender, below umbilicus Ext: No edema, no calf tenderness bilaterally  Labs:  Recent Labs    07/29/20 0650 07/30/20 0506  HGB 11.7* 10.0*    A/P: Pt is a 19 y.o. G1P1001 s/p SVD.  -Pain well controlled -GU: UOP is adequate -GI: Tolerating general diet -Activity: encouraged sitting up to chair and ambulation as tolerated -Prophylaxis: SCDs -Labs: stable as above  Steva Ready, DO

## 2020-07-30 NOTE — Lactation Note (Signed)
This note was copied from a baby's chart. Lactation Consultation Note Baby 4 hrs old. Mom stated she had all ready finished BF and baby in crib sleeping. Mom denied painful latch. Mom eating her supper. Asked mom if she would call for next feeding for [facility] assistance.   Patient Name: Cathy Nguyen CHEKB'T Date: 07/30/2020     Maternal Data    Feeding Feeding Type: Breast Fed  LATCH Score Latch: Repeated attempts needed to sustain latch, nipple held in mouth throughout feeding, stimulation needed to elicit sucking reflex.  Audible Swallowing: A few with stimulation  Type of Nipple: Everted at rest and after stimulation  Comfort (Breast/Nipple): Soft / non-tender  Hold (Positioning): Assistance needed to correctly position infant at breast and maintain latch.  LATCH Score: 7  Interventions Interventions: Breast feeding basics reviewed;Assisted with latch;Skin to skin;Adjust position;Support pillows  Lactation Tools Discussed/Used     Consult Status      Mychael Smock G 07/30/2020, 12:08 AM

## 2020-07-30 NOTE — Lactation Note (Signed)
This note was copied from a baby's chart. Lactation Consultation Note  Patient Name: Cathy Nguyen Date: 07/30/2020 Reason for consult: Follow-up assessment;1st time breastfeeding;Primapara;Term;Infant weight loss;Other (Comment) (2 % weight loss, ,mom aware to page with feeding cues).  Baby is 54 hours old, mom called for LC concerned the baby acted like he wanted to eat and fell back to sleep. LC explained feeding behaviors and sleep wake stage to mom. Reviewed the doc flow sheets and baby had fed x4 11-7 shift and LC pointed out that is excellent for a newborn. Baby has not stooled yet  And explained to mom the baby is probably not overly hungry until he starts stooling . LC offered to assist with hand expressing and mom preferred to just do STS for now and call LC.  LC reviewed signs of hunger and encouraged to call .  LC had seen mom @ 0224 and baby fed 20 mins with Latch score of - 8.    Maternal Data Has patient been taught Hand Expression?: Yes  Feeding Feeding Type:  (not showing feeding cues)  LATCH Score                   Interventions Interventions: Breast feeding basics reviewed  Lactation Tools Discussed/Used     Consult Status Consult Status: Follow-up Date: 07/30/20 Follow-up type: In-patient    Matilde Sprang Desira Alessandrini 07/30/2020, 9:56 AM

## 2020-07-30 NOTE — Lactation Note (Signed)
This note was copied from a baby's chart. Lactation Consultation Note Baby 7 hrs old. Assisted in latching. Discussed feeding positions. Mom had baby in football hold when [facility] came into room. LC readjusted pillows, baby's position as well as mom's position. Mom has compressible short shaft everted nipples.  Colostrum easily expressed. Mom excited about colostrum. Mom has hand pump at bedside.   Baby BF great. Newborn feeding habits, STS, I&O, breast massage, milk storage, baby's body alignment, supply and demand discussed. Mom encouraged to feed baby 8-12 times/24 hours and with feeding cues. Mom encouraged to waken baby for feeds every 3 hrs if baby hasn't cued for feedings.  Baby BF well. Noted some milk transfer. Breast softer as well as swallows seen. Baby fell asleep, mom placed baby STS on her chest. Baby woke up suckling on his hands. Mom placed baby to opposite breast. LC talked mom through placement and latching. Praised mom.  Lactation brochure given. Mom has WIC.  Patient Name: Cathy Nguyen ZCHYI'F Date: 07/30/2020 Reason for consult: Initial assessment;Primapara;Term   Maternal Data Has patient been taught Hand Expression?: Yes Does the patient have breastfeeding experience prior to this delivery?: No  Feeding Feeding Type: Breast Fed  LATCH Score Latch: Grasps breast easily, tongue down, lips flanged, rhythmical sucking.  Audible Swallowing: A few with stimulation  Type of Nipple: Everted at rest and after stimulation  Comfort (Breast/Nipple): Soft / non-tender  Hold (Positioning): Assistance needed to correctly position infant at breast and maintain latch.  LATCH Score: 8  Interventions Interventions: Breast feeding basics reviewed;Breast compression;Assisted with latch;Adjust position;Skin to skin;Support pillows;Breast massage;Position options;Hand express  Lactation Tools Discussed/Used Tools: Pump Breast pump type: Manual WIC Program: Yes Pump  Review: Setup, frequency, and cleaning;Milk Storage Initiated by:: RN   Consult Status Consult Status: Follow-up Date: 07/30/20 (in pm) Follow-up type: In-patient    Chrystle Murillo, Diamond Nickel 07/30/2020, 2:26 AM

## 2020-07-31 ENCOUNTER — Inpatient Hospital Stay (HOSPITAL_COMMUNITY): Payer: Medicaid Other

## 2020-07-31 ENCOUNTER — Inpatient Hospital Stay (HOSPITAL_COMMUNITY)
Admission: AD | Admit: 2020-07-31 | Payer: Medicaid Other | Source: Home / Self Care | Admitting: Obstetrics and Gynecology

## 2020-07-31 MED ORDER — IBUPROFEN 600 MG PO TABS: 600.0000 mg | ORAL_TABLET | Freq: Four times a day (QID) | ORAL | 0 refills | Status: AC | PRN

## 2020-07-31 MED ORDER — ACETAMINOPHEN 325 MG PO TABS: 650.0000 mg | ORAL_TABLET | ORAL | 1 refills | Status: DC | PRN

## 2020-07-31 NOTE — Discharge Summary (Signed)
Postpartum Discharge Summary  Date of Service updated 07/31/2020     Patient Name: Cathy Nguyen DOB: [birth year] MRN: [id number]  Date of admission: 07/29/2020 Delivery date:07/29/2020  Delivering provider: Noralyn Pick  Date of discharge: 07/31/2020  Admitting diagnosis: Normal labor [O80, Z37.9] Intrauterine pregnancy: [redacted]w[redacted]d    Secondary diagnosis:  Active Problems:   Normal labor  Additional problems: None    Discharge diagnosis: Term Pregnancy Delivered                                              Post partum procedures:None Augmentation: AROM Complications: None  Hospital course: Onset of Labor With Vaginal Delivery      19y.o. yo G1P1001 at 471w0das admitted in Active Labor on 07/29/2020. Patient had an uncomplicated labor course as follows:  Membrane Rupture Time/Date: 8:17 AM ,07/29/2020   Delivery Method:Vaginal, Spontaneous  Episiotomy: None  Lacerations:  2nd degree  Patient had an uncomplicated postpartum course.  She is ambulating, tolerating a regular diet, passing flatus, and urinating well. Patient is discharged home in stable condition on 07/31/20.  Newborn Data: Birth date:07/29/2020  Birth time:7:18 PM  Gender:Female  Living status:Living  Apgars:9 ,9  Weight:3805 g   Magnesium Sulfate received: No BMZ received: No Rhophylac:N/A MMR:N/A T-DaP:Given prenatally Flu: N/A Transfusion:No  Physical exam  Vitals:   07/30/20 0950 07/30/20 1526 07/30/20 2126 07/31/20 0530  BP: 123/69 109/65 126/70 121/71  Pulse: 87 79 91 64  Resp: 18 20 19 19   Temp: 98.1 F (36.7 C) 97.7 F (36.5 C) 98.8 F (37.1 C) 98 F (36.7 C)  TempSrc: Oral Axillary Oral Oral  SpO2: 100% 100% 99% 100%  Weight:      Height:       General: alert, cooperative and no distress Lochia: appropriate Uterine Fundus: firm Incision: N/A DVT Evaluation: No evidence of DVT seen on physical exam. Labs: Lab Results  Component Value Date   WBC 15.2 (H) 07/30/2020   HGB 10.0 (L)  07/30/2020   HCT 30.8 (L) 07/30/2020   MCV 91.1 07/30/2020   PLT 173 07/30/2020   No flowsheet data found. Edinburgh Score: Edinburgh Postnatal Depression Scale Screening Tool 07/30/2020  I have been able to laugh and see the funny side of things. 0  I have looked forward with enjoyment to things. 0  I have blamed myself unnecessarily when things went wrong. 2  I have been anxious or worried for no good reason. 2  I have felt scared or panicky for no good reason. 1  Things have been getting on top of me. 0  I have been so unhappy that I have had difficulty sleeping. 0  I have felt sad or miserable. 0  I have been so unhappy that I have been crying. 0  The thought of harming myself has occurred to me. 0  Edinburgh Postnatal Depression Scale Total 5      After visit meds:  Allergies as of 07/31/2020   No Known Allergies     Medication List    STOP taking these medications   HYDROcodone-acetaminophen 5-325 MG tablet Commonly known as: NORCO/VICODIN     TAKE these medications   acetaminophen 325 MG tablet Commonly known as: Tylenol Take 2 tablets (650 mg total) by mouth every 4 (four) hours as needed for mild pain or moderate pain (for  pain scale < 4).   ibuprofen 600 MG tablet Commonly known as: ADVIL Take 1 tablet (600 mg total) by mouth every 6 (six) hours as needed.   ondansetron 4 MG disintegrating tablet Commonly known as: Zofran ODT Take 1 tablet (4 mg total) by mouth every 8 (eight) hours as needed for nausea or vomiting.        Discharge home in stable condition Infant Feeding: Breast Infant Disposition:home with mother Discharge instruction: per After Visit Summary and Postpartum booklet. Activity: Advance as tolerated. Pelvic rest for 6 weeks.  Diet: routine diet Anticipated Birth Control: Unsure Postpartum Appointment:6 weeks Additional Postpartum F/U: NA Future Appointments:No future appointments. Follow up Visit:  Follow-up Information    Christophe Louis, MD. Schedule an appointment as soon as possible for a visit in 6 week(s).   Specialty: Obstetrics and Gynecology Why: please schedule postpartum visit in 6 weeks if you do not already have an appointment scheduled  Contact information: [place] [place] [address] [phone]                   07/31/2020 Christophe Louis, MD

## 2020-07-31 NOTE — Lactation Note (Signed)
This note was copied from a baby's chart. Lactation Consultation Note  Patient Name: Cathy Nguyen [id number] Date: 07/31/2020 Reason for consult: Follow-up assessment;Primapara;1st time breastfeeding;Term;Infant weight loss;Other (Comment) (Bili WNL)  Baby is 37 hours old / Bili 4.5 at 34 hours   per mom was unable to satisfy baby just had the breast and ended up supplementing with formula.  LC reviewed the doc flow sheets and the baby last was at the breast at 2315 , and mom confirmed and since had all bottles.  LC reviewed supply and demand / and discussed how to transition the baby back to the breast , may have to give an appetizer of EBM or formula and then  Latch. LC stressed the importance of the 1st 2 weeks of breast feeding.  Sore nipple and engorgement reviewed and storage of breast milk.  Mom has a Hand pump - given with instruction yesterday.  Mom has the [facility] pamphlet with phone numbers.    Maternal Data Has patient been taught Hand Expression?: Yes  Feeding Feeding Type:  (last feeding was formula) Nipple Type: Slow - flow  LATCH Score                   Interventions Interventions: Breast feeding basics reviewed;Hand pump  Lactation Tools Discussed/Used Tools: Pump Breast pump type: Manual Pump Review: Milk Storage   Consult Status Consult Status: Complete Date: 07/31/20    Kathrin Greathouse 07/31/2020, 8:53 AM

## 2023-01-28 ENCOUNTER — Emergency Department (HOSPITAL_COMMUNITY): Payer: Commercial Managed Care - PPO

## 2023-01-28 ENCOUNTER — Encounter (HOSPITAL_COMMUNITY): Payer: Self-pay

## 2023-01-28 ENCOUNTER — Other Ambulatory Visit: Payer: Self-pay

## 2023-01-28 ENCOUNTER — Emergency Department (HOSPITAL_COMMUNITY)
Admission: EM | Admit: 2023-01-28 | Discharge: 2023-01-28 | Disposition: A | Discharge status: Home / Self Care | Payer: Commercial Managed Care - PPO | Attending: Emergency Medicine | Admitting: Emergency Medicine

## 2023-01-28 DIAGNOSIS — M546 Pain in thoracic spine: Secondary | ICD-10-CM | POA: Insufficient documentation

## 2023-01-28 DIAGNOSIS — M549 Dorsalgia, unspecified: Secondary | ICD-10-CM | POA: Diagnosis present

## 2023-01-28 DIAGNOSIS — M545 Low back pain, unspecified: Secondary | ICD-10-CM | POA: Diagnosis not present

## 2023-01-28 MED ORDER — METHOCARBAMOL 500 MG PO TABS: 500.0000 mg | ORAL_TABLET | Freq: Two times a day (BID) | ORAL | 0 refills | Status: AC

## 2023-01-28 MED ORDER — ACETAMINOPHEN 500 MG PO TABS: 500.0000 mg | ORAL_TABLET | Freq: Four times a day (QID) | ORAL | 0 refills | Status: AC | PRN

## 2023-01-28 MED ORDER — METHOCARBAMOL 500 MG PO TABS: 1000.0000 mg | ORAL_TABLET | Freq: Once | ORAL | Status: DC

## 2023-01-28 MED ORDER — LIDOCAINE 5 % EX PTCH: 1.0000 | MEDICATED_PATCH | CUTANEOUS | 0 refills | Status: AC

## 2023-01-28 MED ORDER — NAPROXEN 500 MG PO TABS
500.0000 mg | ORAL_TABLET | Freq: Once | ORAL | Status: AC
  Administered 2023-01-28: 500 mg via ORAL
  Filled 2023-01-28: qty 1

## 2023-01-28 MED ORDER — LIDOCAINE 5 % EX PTCH
1.0000 | MEDICATED_PATCH | CUTANEOUS | Status: DC
  Administered 2023-01-28: 1 via TRANSDERMAL
  Filled 2023-01-28: qty 1
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
